# Patient Record
Sex: Male | Born: 1995 | Race: White | Hispanic: No | Marital: Single | Smoking: Never smoker
Health system: Southern US, Community
[De-identification: ages and names within clinical notes are randomized; demographics above are authoritative.]

## PROBLEM LIST (undated history)

## (undated) DIAGNOSIS — M1990 Unspecified osteoarthritis, unspecified site: Secondary | ICD-10-CM

## (undated) DIAGNOSIS — L409 Psoriasis, unspecified: Secondary | ICD-10-CM

## (undated) DIAGNOSIS — B1920 Unspecified viral hepatitis C without hepatic coma: Secondary | ICD-10-CM

---

## 2014-01-24 ENCOUNTER — Emergency Department (HOSPITAL_COMMUNITY): Payer: 59

## 2014-01-24 ENCOUNTER — Encounter (HOSPITAL_COMMUNITY): Payer: Self-pay | Admitting: Emergency Medicine

## 2014-01-24 ENCOUNTER — Emergency Department (HOSPITAL_COMMUNITY)
Admission: EM | Admit: 2014-01-24 | Discharge: 2014-01-25 | Disposition: A | Discharge status: Home / Self Care | Payer: 59 | Attending: Emergency Medicine | Admitting: Emergency Medicine

## 2014-01-24 DIAGNOSIS — L4050 Arthropathic psoriasis, unspecified: Secondary | ICD-10-CM | POA: Insufficient documentation

## 2014-01-24 DIAGNOSIS — R112 Nausea with vomiting, unspecified: Secondary | ICD-10-CM | POA: Insufficient documentation

## 2014-01-24 DIAGNOSIS — R1110 Vomiting, unspecified: Secondary | ICD-10-CM | POA: Insufficient documentation

## 2014-01-24 DIAGNOSIS — Z79899 Other long term (current) drug therapy: Secondary | ICD-10-CM | POA: Insufficient documentation

## 2014-01-24 DIAGNOSIS — R000 Tachycardia, unspecified: Secondary | ICD-10-CM | POA: Insufficient documentation

## 2014-01-24 HISTORY — DX: Psoriasis, unspecified: L40.9

## 2014-01-24 HISTORY — DX: Unspecified osteoarthritis, unspecified site: M19.90

## 2014-01-24 LAB — COMPREHENSIVE METABOLIC PANEL
ALBUMIN: 3.8 g/dL (ref 3.5–5.2)
ALK PHOS: 81 U/L (ref 39–117)
ALT: 14 U/L (ref 0–53)
ANION GAP: 13 (ref 5–15)
AST: 14 U/L (ref 0–37)
BILIRUBIN TOTAL: 0.5 mg/dL (ref 0.3–1.2)
BUN: 11 mg/dL (ref 6–23)
CO2: 28 mEq/L (ref 19–32)
Calcium: 9.3 mg/dL (ref 8.4–10.5)
Chloride: 100 mEq/L (ref 96–112)
Creatinine, Ser: 1.11 mg/dL (ref 0.50–1.35)
GFR calc Af Amer: >90 mL/min (ref >90)
GFR calc non Af Amer: >90 mL/min (ref >90)
GLUCOSE: 72 mg/dL (ref 70–99)
POTASSIUM: 3.7 meq/L (ref 3.7–5.3)
SODIUM: 141 meq/L (ref 137–147)
TOTAL PROTEIN: 8.3 g/dL (ref 6.0–8.3)

## 2014-01-24 LAB — CBC WITH DIFFERENTIAL/PLATELET
BASOS PCT: 0 % (ref 0–1)
Basophils Absolute: 0 10*3/uL (ref 0.0–0.1)
Eosinophils Absolute: 0.1 10*3/uL (ref 0.0–0.7)
Eosinophils Relative: 1 % (ref 0–5)
HCT: 42.6 % (ref 39.0–52.0)
HEMOGLOBIN: 14.9 g/dL (ref 13.0–17.0)
LYMPHS ABS: 3.2 10*3/uL (ref 0.7–4.0)
Lymphocytes Relative: 32 % (ref 12–46)
MCH: 29.2 pg (ref 26.0–34.0)
MCHC: 35 g/dL (ref 30.0–36.0)
MCV: 83.5 fL (ref 78.0–100.0)
Monocytes Absolute: 0.8 10*3/uL (ref 0.1–1.0)
Monocytes Relative: 8 % (ref 3–12)
NEUTROS PCT: 59 % (ref 43–77)
Neutro Abs: 6 10*3/uL (ref 1.7–7.7)
PLATELETS: 255 10*3/uL (ref 150–400)
RBC: 5.1 MIL/uL (ref 4.22–5.81)
RDW: 12.4 % (ref 11.5–15.5)
WBC: 10.2 10*3/uL (ref 4.0–10.5)

## 2014-01-24 LAB — URINALYSIS, ROUTINE W REFLEX MICROSCOPIC
BILIRUBIN URINE: NEGATIVE
GLUCOSE, UA: NEGATIVE mg/dL
HGB URINE DIPSTICK: NEGATIVE
Ketones, ur: NEGATIVE mg/dL
Leukocytes, UA: NEGATIVE
Nitrite: NEGATIVE
PH: 7 (ref 5.0–8.0)
Protein, ur: NEGATIVE mg/dL
SPECIFIC GRAVITY, URINE: 1.02 (ref 1.005–1.030)
Urobilinogen, UA: >8 mg/dL — ABNORMAL HIGH (ref 0.0–1.0)

## 2014-01-24 LAB — LIPASE, BLOOD: Lipase: 32 U/L (ref 11–59)

## 2014-01-24 MED ORDER — ONDANSETRON HCL 4 MG/2ML IJ SOLN
4.0000 mg | Freq: Once | INTRAMUSCULAR | Status: AC
  Administered 2014-01-24: 4 mg via INTRAVENOUS
  Filled 2014-01-24: qty 2

## 2014-01-24 MED ORDER — SODIUM CHLORIDE 0.9 % IV BOLUS (SEPSIS)
1000.0000 mL | Freq: Once | INTRAVENOUS | Status: AC
  Administered 2014-01-24: 1000 mL via INTRAVENOUS

## 2014-01-24 MED ORDER — HYDROMORPHONE HCL PF 1 MG/ML IJ SOLN
1.0000 mg | Freq: Once | INTRAMUSCULAR | Status: AC
  Administered 2014-01-24: 1 mg via INTRAVENOUS

## 2014-01-24 MED ORDER — SODIUM CHLORIDE 0.9 % IV SOLN
Freq: Once | INTRAVENOUS | Status: AC
  Administered 2014-01-24: 23:00:00 via INTRAVENOUS

## 2014-01-24 MED ORDER — HYDROMORPHONE HCL PF 1 MG/ML IJ SOLN
INTRAMUSCULAR | Status: AC
  Filled 2014-01-24: qty 1

## 2014-01-24 NOTE — ED Provider Notes (Signed)
CSN: [id number]     Arrival date & time 01/24/14  2131 History   First MD Initiated Contact with Patient 01/24/14 2232     Chief Complaint  Patient presents with  . Emesis     (Consider location/radiation/quality/duration/timing/severity/associated sxs/prior Treatment) Patient is a 18 y.o. male presenting with vomiting. The history is provided by the patient. No language interpreter was used.  Emesis Severity:  Severe Duration:  2 weeks Timing:  Constant Number of daily episodes:  Multiple Quality:  Undigested food and stomach contents Progression:  Worsening Chronicity:  New Relieved by:  Nothing Worsened by:  Nothing tried Ineffective treatments:  None tried  Pt has a history of psoiatic arthritis.   Pt reports he is on remicade.  Pt is 2 weeks past his normal infusion.   [facility]  [facility]  Pt complains of nausea and weakness,  Pt unable to eat or drink.  No fever or chills.   Past Medical History  Diagnosis Date  . Arthritis   . Psoriasis    History reviewed. No pertinent past surgical history. History reviewed. No pertinent family history. History  Substance Use Topics  . Smoking status: Never Smoker   . Smokeless tobacco: Current User  . Alcohol Use: Not on file    Review of Systems  Gastrointestinal: Positive for nausea and vomiting.  All other systems reviewed and are negative.     Allergies  Red dye  Home Medications   Prior to Admission medications   Medication Sig Start Date End Date Taking? Authorizing Provider  inFLIXimab (REMICADE) 100 MG injection Inject 500 mg into the vein every 8 (eight) weeks.   Yes Historical Provider, MD   BP 131/73  Pulse 111  Temp(Src) 98.6 F (37 C) (Oral)  Resp 16  Ht 5\' 9"  (1.753 m)  Wt 200 lb (90.719 kg)  BMI 29.52 kg/m2  SpO2 98% Physical Exam  Nursing note and vitals reviewed. Constitutional: He is oriented to person, place, and time. He appears  well-developed and well-nourished.  HENT:  Head: Normocephalic.  Mouth/Throat: Oropharynx is clear and moist.  Eyes: Conjunctivae and EOM are normal. Pupils are equal, round, and reactive to light.  Neck: Normal range of motion.  Cardiovascular:  tachycardia  Pulmonary/Chest: Effort normal.  Abdominal: Soft. He exhibits no distension.  Musculoskeletal: Normal range of motion.  Neurological: He is alert and oriented to person, place, and time.  Skin: Rash noted.  Psychiatric: He has a normal mood and affect.    ED Course  Procedures (including critical care time) Labs Review Labs Reviewed  CBC WITH DIFFERENTIAL  COMPREHENSIVE METABOLIC PANEL  LIPASE, BLOOD  URINALYSIS, ROUTINE W REFLEX MICROSCOPIC    Imaging Review No results found.   EKG Interpretation None      MDM  Pt given Iv Ns x 2 liters.    Pt given rx for pain.    [facility]   Final diagnoses:  Psoriatic arthritis        Elson AreasLeslie K Deago Burruss, PA-C 01/25/14 0040  Lonia SkinnerLeslie K ElbingSofia, PA-C 01/25/14 0040

## 2014-01-24 NOTE — ED Notes (Signed)
Remicade infusions at [facility] Q8 weeks, saw a different doctor and infusion was missed by 2 weeks, symptoms due to missing infusion. Pt has N&V several days and has been weak, and possibly in need of "fluids", has not been able to work. States could not even "urinate without puking"

## 2014-01-25 MED ORDER — OXYCODONE-ACETAMINOPHEN 5-325 MG PO TABS: 2.0000 | ORAL_TABLET | ORAL | Status: AC | PRN

## 2014-01-25 MED ORDER — ONDANSETRON 4 MG PO TBDP: 4.0000 mg | ORAL_TABLET | Freq: Three times a day (TID) | ORAL | Status: AC | PRN

## 2014-01-25 MED ORDER — HYDROMORPHONE HCL PF 1 MG/ML IJ SOLN
1.0000 mg | Freq: Once | INTRAMUSCULAR | Status: AC
  Administered 2014-01-25: 1 mg via INTRAVENOUS
  Filled 2014-01-25: qty 1

## 2014-01-25 NOTE — Discharge Instructions (Signed)
Psoriasis Psoriasis is a common, long-lasting (chronic) inflammation of the skin. It affects both men and women equally, of all ages and all races. Psoriasis cannot be passed from person to person (not contagious). Psoriasis varies from mild to very severe. When severe, it can greatly affect your quality of life. Psoriasis is an inflammatory disorder affecting the skin as well as other organs including the joints (causing an arthritis). With psoriasis, the skin sheds its top layer of cells more rapidly than it does in someone without psoriasis. CAUSES  The cause of psoriasis is largely unknown. Genetics, your immune system, and the environment seem to play a role in causing psoriasis. Factors that can make psoriasis worse include:  Damage or trauma to the skin, such as cuts, scrapes, and sunburn. This damage often causes new areas of psoriasis (lesions).  Winter dryness and lack of sunlight.  Medicines such as lithium, beta-blockers, antimalarial drugs, ACE inhibitors, nonsteroidal anti-inflammatory drugs (ibuprofen, aspirin), and terbinafine. Let your caregiver know if you are taking any of these drugs.  Alcohol. Excessive alcohol use should be avoided if you have psoriasis. Drinking large amounts of alcohol can affect:  How well your psoriasis treatment works.  How safe your psoriasis treatment is.  Smoking. If you smoke, ask your caregiver for help to quit.  Stress.  Bacterial or viral infections.  Arthritis. Arthritis associated with psoriasis (psoriatic arthritis) affects less than 10% of patients with psoriasis. The arthritic intensity does not always match the skin psoriasis intensity. It is important to let your caregiver know if your joints hurt or if they are stiff. SYMPTOMS  The most common form of psoriasis begins with little red bumps that gradually become larger. The bumps begin to form scales that flake off easily. The lower layers of scales stick together. When these scales  are scratched or removed, the underlying skin is tender and bleeds easily. These areas then grow in size and may become large. Psoriasis often creates a rash that looks the same on both sides of the body (symmetrical). It often affects the elbows, knees, groin, genitals, arms, legs, scalp, and nails. Affected nails often have pitting, loosen, thicken, crumble, and are difficult to treat.  "Inverse psoriasis"occurs in the armpits, under breasts, in skin folds, and around the groin, buttocks, and genitals.  "Guttate psoriasis" generally occurs in children and young adults following a recent sore throat (strep throat). It begins with many small, red, scaly spots on the skin. It clears spontaneously in weeks or a few months without treatment. DIAGNOSIS  Psoriasis is diagnosed by physical exam. A tissue sample (biopsy) may also be taken. TREATMENT The treatment of psoriasis depends on your age, health, and living conditions.  Steroid (cortisone) creams, lotions, and ointments may be used. These treatments are associated with thinning of the skin, blood vessels that get larger (dilated), loss of skin pigmentation, and easy bruising. It is important to use these steroids as directed by your caregiver. Only treat the affected areas and not the normal, unaffected skin. People on long-term steroid treatment should wear a medical alert bracelet. Injections may be used in areas that are difficult to treat.  Scalp treatments are available as shampoos, solutions, sprays, foams, and oils. Avoid scratching the scalp and picking at the scales.  Anthralin medicine works well on areas that are difficult to treat. However, it stains clothes and skin and may cause temporary irritation.  Synthetic vitamin D (calcipotriene)can be used on small areas. It is available by prescription. The forms   of synthetic vitamin D available in health food stores do not help with psoriasis.  Coal tarsare available in various strengths  for psoriasis that is difficult to treat. They are one of the longest used treatments for difficult to treat psoriasis. However, they are messy to use.  Light therapy (UV therapy) can be carefully and professionally monitored in a dermatologist's office. Careful sunbathing is helpful for many people as directed by your caregiver. The exposure should be just long enough to cause a mild redness (erythema) of your skin. Avoid sunburn as this may make the condition worse. Sunscreen (SPF of 30 or higher) should be used to protect against sunburn. Cataracts, wrinkles, and skin aging are some of the harmful side effects of light therapy.  If creams (topical medicines) fail, there are several other options for systemic or oral medicines your caregiver can suggest. Psoriasis can sometimes be very difficult to treat. It can come and go. It is necessary to follow up with your caregiver regularly if your psoriasis is difficult to treat. Usually, with persistence you can get a good amount of relief. Maintaining consistent care is important. Do not change caregivers just because you do not see immediate results. It may take several trials to find the right combination of treatment for you. PREVENTING FLARE-UPS  Wear gloves while you wash dishes, while cleaning, and when you are outside in the cold.  If you have radiators, place a bowl of water or damp towel on the radiator. This will help put water back in the air. You can also use a humidifier to keep the air moist. Try to keep the humidity at about 60% in your home.  Apply moisturizer while your skin is still damp from bathing or showering. This traps water in the skin.  Avoid long, hot baths or showers. Keep soap use to a minimum. Soaps dry out the skin and wash away the protective oils. Use a fragrance free, dye free soap.  Drink enough water and fluids to keep your urine clear or pale yellow. Not drinking enough water depletes your skin's water  supply.  Turn off the heat at night and keep it low during the day. Cool air is less drying. SEEK MEDICAL CARE IF:  You have increasing pain in the affected areas.  You have uncontrolled bleeding in the affected areas.  You have increasing redness or warmth in the affected areas.  You start to have pain or stiffness in your joints.  You start feeling depressed about your condition.  You have a fever. Document Released: 06/10/2000 Document Revised: 09/05/2011 Document Reviewed: 12/06/2010 Exit[employer] Patient Information 2015 [employer]. This information is not intended to replace advice given to you by your health care provider. Make sure you discuss any questions you have with your health care provider.  

## 2014-01-25 NOTE — ED Provider Notes (Signed)
Medical screening examination/treatment/procedure(s) were performed by non-physician practitioner and as supervising physician I was immediately available for consultation/collaboration.   EKG Interpretation None        Dane Kopke, MD 01/25/14 0128 

## 2015-09-27 ENCOUNTER — Emergency Department (HOSPITAL_COMMUNITY)
Admission: EM | Admit: 2015-09-27 | Discharge: 2015-09-27 | Disposition: A | Discharge status: Home / Self Care | Payer: BLUE CROSS/BLUE SHIELD | Attending: Emergency Medicine | Admitting: Emergency Medicine

## 2015-09-27 ENCOUNTER — Emergency Department (HOSPITAL_COMMUNITY): Payer: BLUE CROSS/BLUE SHIELD

## 2015-09-27 ENCOUNTER — Encounter (HOSPITAL_COMMUNITY): Payer: Self-pay | Admitting: *Deleted

## 2015-09-27 DIAGNOSIS — R079 Chest pain, unspecified: Secondary | ICD-10-CM

## 2015-09-27 DIAGNOSIS — Z79899 Other long term (current) drug therapy: Secondary | ICD-10-CM | POA: Diagnosis not present

## 2015-09-27 DIAGNOSIS — R0602 Shortness of breath: Secondary | ICD-10-CM

## 2015-09-27 DIAGNOSIS — Z791 Long term (current) use of non-steroidal anti-inflammatories (NSAID): Secondary | ICD-10-CM | POA: Diagnosis not present

## 2015-09-27 HISTORY — DX: Unspecified viral hepatitis C without hepatic coma: B19.20

## 2015-09-27 LAB — BASIC METABOLIC PANEL
ANION GAP: 12 (ref 5–15)
BUN: 8 mg/dL (ref 6–20)
CALCIUM: 9.2 mg/dL (ref 8.9–10.3)
CO2: 22 mmol/L (ref 22–32)
Chloride: 102 mmol/L (ref 101–111)
Creatinine, Ser: 1.09 mg/dL (ref 0.61–1.24)
GFR calc Af Amer: >60 mL/min (ref >60)
GLUCOSE: 104 mg/dL — AB (ref 65–99)
Potassium: 3.3 mmol/L — ABNORMAL LOW (ref 3.5–5.1)
SODIUM: 136 mmol/L (ref 135–145)

## 2015-09-27 LAB — CBC
HCT: 36.5 % — ABNORMAL LOW (ref 39.0–52.0)
Hemoglobin: 12.1 g/dL — ABNORMAL LOW (ref 13.0–17.0)
MCH: 26.7 pg (ref 26.0–34.0)
MCHC: 33.2 g/dL (ref 30.0–36.0)
MCV: 80.4 fL (ref 78.0–100.0)
Platelets: 210 10*3/uL (ref 150–400)
RBC: 4.54 MIL/uL (ref 4.22–5.81)
RDW: 13.5 % (ref 11.5–15.5)
WBC: 5.8 10*3/uL (ref 4.0–10.5)

## 2015-09-27 LAB — D-DIMER, QUANTITATIVE: D-Dimer, Quant: 3.38 ug/mL-FEU — ABNORMAL HIGH (ref 0.00–0.50)

## 2015-09-27 MED ORDER — IBUPROFEN 600 MG PO TABS: 600.0000 mg | ORAL_TABLET | Freq: Three times a day (TID) | ORAL | Status: AC | PRN

## 2015-09-27 MED ORDER — ONDANSETRON HCL 4 MG/2ML IJ SOLN
4.0000 mg | Freq: Once | INTRAMUSCULAR | Status: AC
  Administered 2015-09-27: 4 mg via INTRAVENOUS
  Filled 2015-09-27: qty 2

## 2015-09-27 MED ORDER — HYDROMORPHONE HCL 1 MG/ML IJ SOLN
1.0000 mg | Freq: Once | INTRAMUSCULAR | Status: AC
  Administered 2015-09-27: 1 mg via INTRAVENOUS
  Filled 2015-09-27: qty 1

## 2015-09-27 MED ORDER — MORPHINE SULFATE (PF) 4 MG/ML IV SOLN
4.0000 mg | Freq: Once | INTRAVENOUS | Status: AC
  Administered 2015-09-27: 4 mg via INTRAVENOUS
  Filled 2015-09-27: qty 1

## 2015-09-27 MED ORDER — SODIUM CHLORIDE 0.9 % IV BOLUS (SEPSIS)
1000.0000 mL | Freq: Once | INTRAVENOUS | Status: AC
  Administered 2015-09-27: 1000 mL via INTRAVENOUS

## 2015-09-27 MED ORDER — KETOROLAC TROMETHAMINE 30 MG/ML IJ SOLN
30.0000 mg | Freq: Once | INTRAMUSCULAR | Status: DC
  Filled 2015-09-27: qty 1

## 2015-09-27 MED ORDER — IOPAMIDOL (ISOVUE-370) INJECTION 76%
100.0000 mL | Freq: Once | INTRAVENOUS | Status: AC | PRN
  Administered 2015-09-27: 100 mL via INTRAVENOUS

## 2015-09-27 NOTE — ED Notes (Signed)
Pt c/o left side chest pain that pt is not able to describe, states that the pain is worse with movement, sob that started tonight, on arrival to er, pt hyperventilating, comfort measures provided,

## 2015-09-27 NOTE — ED Notes (Signed)
Patient verbalizes understanding of discharge instructions, prescription medications, home care and follow up care. Patient out of department at this time with family. 

## 2015-09-27 NOTE — ED Notes (Signed)
Patient was sleeping in the bed. Patient woke up when this nurse walked into room. Patient lying in bed with male visitor. Patient woke and states "im still in pain" explained to patient that I have an order for more pain medication, and explained what ordered pain medication was. Patient states "i dont want that fucking medicine, it wont do shit for me" this nurse verified with patient that he doesn't want the ordered pain medication, and patient replied "no" patient remains in bed, with male visitor and family member at bedside.

## 2015-09-27 NOTE — ED Provider Notes (Signed)
CSN: [id number]     Arrival date & time 09/27/15  0126 History   First MD Initiated Contact with Patient 09/27/15 [phone]     Chief Complaint  Patient presents with  . Chest Pain     HPI Patient presents with severe onset left-sided chest pain shortness of breath.  Reports this started abruptly this evening.  He did have some shortness of breath earlier in the week but it was transient and now his symptoms became acute.  No history DVT or pulmonary embolism.  Denies unilateral leg swelling.  No fevers or chills.  Denies productive cough.  Denies upper back discomfort or pain.  No tearing sensation to his symptoms.  He reports severe pain in his left chest at this time.  He is clutching his left chest.   Past Medical History  Diagnosis Date  . Arthritis   . Psoriasis   . Hepatitis C    History reviewed. No pertinent past surgical history. No family history on file. Social History  Substance Use Topics  . Smoking status: Never Smoker   . Smokeless tobacco: Current User  . Alcohol Use: None    Review of Systems  All other systems reviewed and are negative.     Allergies  Red dye  Home Medications   Prior to Admission medications   Medication Sig Start Date End Date Taking? Authorizing Provider  Apremilast (OTEZLA) 30 MG TABS Take by mouth.   Yes Historical Provider, MD  Diclofenac-Misoprostol (ARTHROTEC) 50-0.2 MG TBEC Take by mouth.   Yes Historical Provider, MD  ondansetron (ZOFRAN ODT) 4 MG disintegrating tablet Take 1 tablet (4 mg total) by mouth every 8 (eight) hours as needed for nausea or vomiting. Patient taking differently: Take 8 mg by mouth every 8 (eight) hours as needed for nausea or vomiting.  01/25/14  Yes Lonia Skinner Sofia, PA-C  pantoprazole (PROTONIX) 40 MG tablet Take 40 mg by mouth daily.   Yes Historical Provider, MD  inFLIXimab (REMICADE) 100 MG injection Inject 500 mg into the vein every 8 (eight) weeks.    Historical Provider, MD  oxyCODONE-acetaminophen  (PERCOCET/ROXICET) 5-325 MG per tablet Take 2 tablets by mouth every 4 (four) hours as needed for moderate pain or severe pain. 01/25/14   Elson Areas, PA-C   BP 134/73 mmHg  Pulse 116  Temp(Src) 97.8 F (36.6 C) (Oral)  Resp 34  Ht  (1.803 m)  Wt 169 lb (76.658 kg)  BMI 23.58 kg/m2  SpO2 100% Physical Exam  Constitutional: He is oriented to person, place, and time. He appears well-developed and well-nourished. He appears distressed.  Uncomfortable appearing  HENT:  Head: Normocephalic and atraumatic.  Eyes: EOM are normal.  Neck: Normal range of motion.  Cardiovascular: Regular rhythm, normal heart sounds and intact distal pulses.   Tachycardia  Pulmonary/Chest: Effort normal and breath sounds normal. No respiratory distress.  Abdominal: Soft. He exhibits no distension. There is no tenderness.  Musculoskeletal: Normal range of motion.  Neurological: He is alert and oriented to person, place, and time.  Skin: Skin is warm and dry. He is not diaphoretic.  Psychiatric: He has a normal mood and affect. Judgment normal.  Nursing note and vitals reviewed.   ED Course  Procedures (including critical care time) Labs Review Labs Reviewed  CBC - Abnormal; Notable for the following:    Hemoglobin 12.1 (*)    HCT 36.5 (*)    All other components within normal limits  BASIC METABOLIC PANEL -  Abnormal; Notable for the following:    Potassium 3.3 (*)    Glucose, Bld 104 (*)    All other components within normal limits  D-DIMER, QUANTITATIVE (NOT AT [facility]) - Abnormal; Notable for the following:    D-Dimer, Quant 3.38 (*)    All other components within normal limits    Imaging Review Ct Angio Chest Pe W/cm &/or Wo Cm  09/27/2015  CLINICAL DATA:  Acute onset of left-sided chest pain. Elevated D-dimer and shortness of breath. Initial encounter. EXAM: CT ANGIOGRAPHY CHEST WITH CONTRAST TECHNIQUE: Multidetector CT imaging of the chest was performed using the standard protocol during  bolus administration of intravenous contrast. Multiplanar CT image reconstructions and MIPs were obtained to evaluate the vascular anatomy. CONTRAST:  100 mL of Isovue 370 IV contrast COMPARISON:  Chest radiograph performed earlier today at 2:40 a.m. FINDINGS: There is no evidence of pulmonary embolus. The lungs are clear bilaterally. There is no evidence of significant focal consolidation, pleural effusion or pneumothorax. No masses are identified; no abnormal focal contrast enhancement is seen. The mediastinum is unremarkable in appearance. No mediastinal lymphadenopathy is seen. No pericardial effusion is identified. There is suggestion of mild wall thickening along the distal esophagus, which may reflect mild esophagitis. A few adjacent lymph nodes are seen, measuring up to 9 mm in size. No axillary lymphadenopathy is seen. The thyroid gland is unremarkable in appearance. The visualized portions of the liver and spleen are unremarkable. The visualized portions of the pancreas, stomach, adrenal glands and kidneys are within normal limits. No acute osseous abnormalities are seen. Review of the MIP images confirms the above findings. IMPRESSION: 1. No evidence of pulmonary embolus. 2. Lungs clear bilaterally. 3. Suggestion of mild wall thickening along the distal esophagus, which may reflect mild esophagitis. Few adjacent lymph nodes measure up to 9 mm in size. Would correlate with the patient's symptoms. Electronically Signed   By: Roanna Raider M.D.   On: 09/27/2015 03:54   Dg Chest Portable 1 View  09/27/2015  CLINICAL DATA:  Acute onset of left-sided chest pain. Initial encounter. EXAM: PORTABLE CHEST 1 VIEW COMPARISON:  Chest radiograph from 01/24/2014 FINDINGS: The lungs are well-aerated and clear. There is no evidence of focal opacification, pleural effusion or pneumothorax. The cardiomediastinal silhouette is within normal limits. No acute osseous abnormalities are seen. IMPRESSION: No acute  cardiopulmonary process seen. Electronically Signed   By: Roanna Raider M.D.   On: 09/27/2015 02:57   I have personally reviewed and evaluated these images and lab results as part of my medical decision-making.   EKG Interpretation   Date/Time:  Sunday September 27 2015 01:35:00 EDT Ventricular Rate:  118 PR Interval:  157 QRS Duration: 98 QT Interval:  339 QTC Calculation: 475 R Axis:   92 Text Interpretation:  Sinus tachycardia Borderline right axis deviation  Borderline prolonged QT interval No old tracing to compare Confirmed by  Mahsa Hanser  MD, Sharline Lehane ([id number]) on 09/27/2015 4:25:28 AM      MDM   Final diagnoses:  Chest pain, unspecified chest pain type  SOB (shortness of breath)    Improvement in patient's pain.  His d-dimer was markedly elevated.  CT angiogram of his chest demonstrates no evidence of PE and a normal caliber aorta.  Doubt aortic dissection.  No hypoxia.  Labs otherwise unremarkable.  Pain improved in the emergency department with a peer pain medications and he was prescribed ibuprofen for possible left-sided pleurisy.  Discharge home in good condition.  Primary care  follow-up.  Doubt ACS.    Azalia BilisKevin Maris Abascal, MD 09/27/15 [phone]

## 2015-09-27 NOTE — Discharge Instructions (Signed)
Pleurisy  Pleurisy is an inflammation and swelling of the lining of the lungs (pleura). Because of this inflammation, it hurts to breathe. It can be aggravated by coughing, laughing, or deep breathing. Pleurisy is often caused by an underlying infection or disease.   HOME CARE INSTRUCTIONS   Monitor your pleurisy for any changes. The following actions may help to alleviate any discomfort you are experiencing:  · Medicine may help with pain. Only take over-the-counter or prescription medicines for pain, discomfort, or fever as directed by your health care provider.  · Only take antibiotic medicine as directed. Make sure to finish it even if you start to feel better.  SEEK MEDICAL CARE IF:   · Your pain is not controlled with medicine or is increasing.  · You have an increase in pus-like (purulent) secretions brought up with coughing.  SEEK IMMEDIATE MEDICAL CARE IF:   · You have blue or dark lips, fingernails, or toenails.  · You are coughing up blood.  · You have increased difficulty breathing.  · You have continuing pain unrelieved by medicine or pain lasting more than 1 week.  · You have pain that radiates into your neck, arms, or jaw.  · You develop increased shortness of breath or wheezing.  · You develop a fever, rash, vomiting, fainting, or other serious symptoms.  MAKE SURE YOU:  · Understand these instructions.    · Will watch your condition.    · Will get help right away if you are not doing well or get worse.        This information is not intended to replace advice given to you by your health care provider. Make sure you discuss any questions you have with your health care provider.     Document Released: 06/13/2005 Document Revised: 02/13/2013 Document Reviewed: 11/25/2012  Elsevier Interactive Patient Education ©2016 [employer]

## 2017-04-02 IMAGING — CT CT ANGIO CHEST
2 of 6 series · 6 of 36 positions shown · IV contrast (Omnipaque 300)
Comparison: Chest radiograph performed earlier today at [DATE] a.m.

CLINICAL DATA: Acute onset of left-sided chest pain. Elevated
D-dimer and shortness of breath. Initial encounter.

EXAM:
CT ANGIOGRAPHY CHEST WITH CONTRAST
TECHNIQUE: Multidetector CT imaging of the chest was performed using the
standard protocol during bolus administration of intravenous
contrast. Multiplanar CT image reconstructions and MIPs were
obtained to evaluate the vascular anatomy.
CONTRAST:  100 mL of Isovue 370 IV contrast

[Series 4: pe 3.0 b40f · axial · 0.69mm/px · z∈[-249,-69]mm · 5 of 90 slices shown]
[im 15/90  lung]
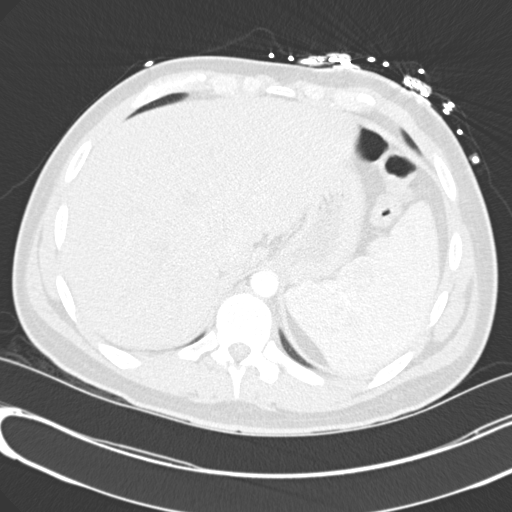
[im 30/90  mediastinal]
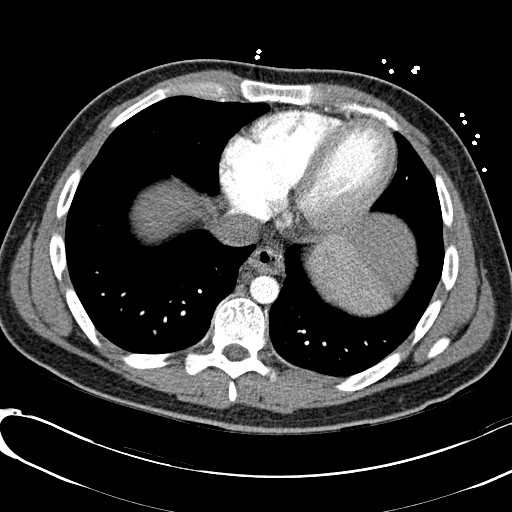
[im 45/90  lung]
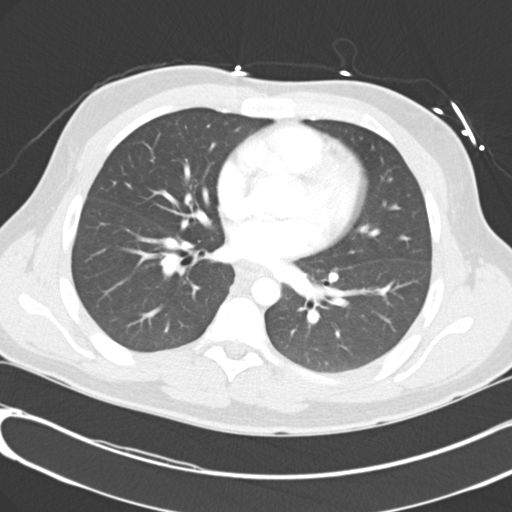
[im 60/90  mediastinal]
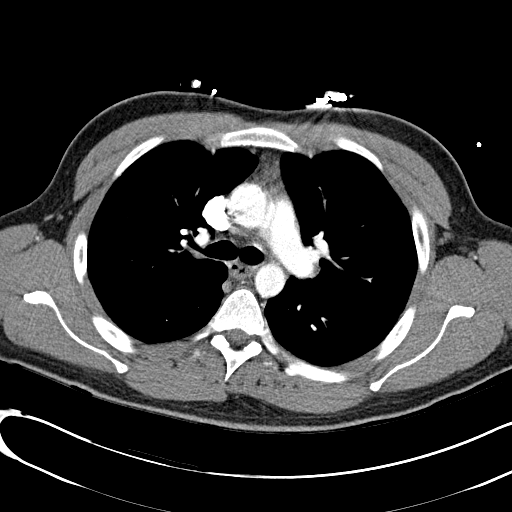
[im 75/90  lung]
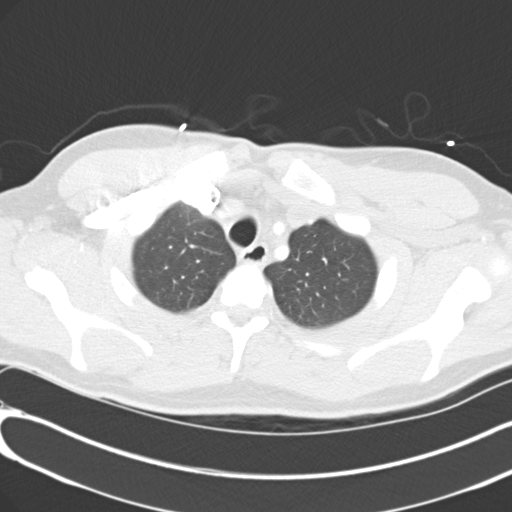

[Series 6: mpr coronal pe 3mm · coronal · 0.54mm/px · 1 of 82 slices shown]
[im 41/82  mediastinal]
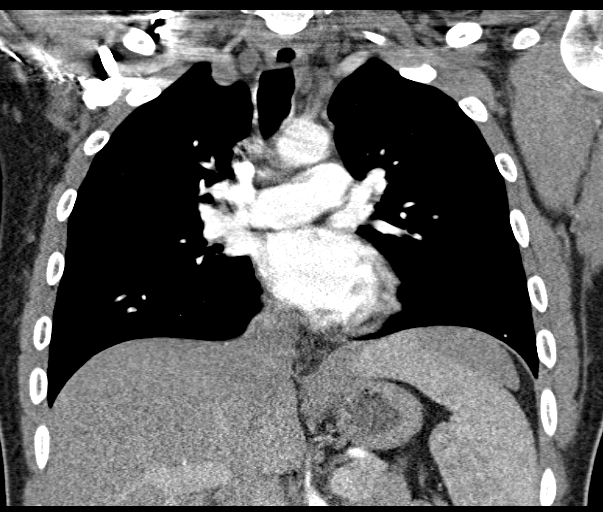

[6 of 36 positions shown; findings below may reference images not displayed]

FINDINGS: There is no evidence of pulmonary embolus.

The lungs are clear bilaterally. There is no evidence of significant
focal consolidation, pleural effusion or pneumothorax. No masses are
identified; no abnormal focal contrast enhancement is seen.

The mediastinum is unremarkable in appearance. No mediastinal
lymphadenopathy is seen. No pericardial effusion is identified.
There is suggestion of mild wall thickening along the distal
esophagus, which may reflect mild esophagitis. A few adjacent lymph
nodes are seen, measuring up to 9 mm in size. No axillary
lymphadenopathy is seen. The thyroid gland is unremarkable in
appearance.

The visualized portions of the liver and spleen are unremarkable.
The visualized portions of the pancreas, stomach, adrenal glands and
kidneys are within normal limits.

No acute osseous abnormalities are seen.

Review of the MIP images confirms the above findings.
IMPRESSION: 1. No evidence of pulmonary embolus.
2. Lungs clear bilaterally.
3. Suggestion of mild wall thickening along the distal esophagus,
which may reflect mild esophagitis. Few adjacent lymph nodes measure
up to 9 mm in size. Would correlate with the patient's symptoms.
